# Patient Record
Sex: Female | Born: 1945 | Race: White | Hispanic: No | Marital: Married | Smoking: Former smoker
Health system: Southern US, Community
[De-identification: ages and names within clinical notes are randomized; demographics above are authoritative.]

---

## 2005-01-07 ENCOUNTER — Ambulatory Visit: Payer: Self-pay | Admitting: Unknown Physician Specialty

## 2005-01-28 ENCOUNTER — Ambulatory Visit: Payer: Self-pay | Admitting: Gynecologic Oncology

## 2005-02-03 ENCOUNTER — Ambulatory Visit: Payer: Self-pay | Admitting: Gynecologic Oncology

## 2005-02-26 ENCOUNTER — Ambulatory Visit: Payer: Self-pay | Admitting: Family Medicine

## 2005-03-06 ENCOUNTER — Ambulatory Visit: Payer: Self-pay | Admitting: Gynecologic Oncology

## 2005-08-11 ENCOUNTER — Ambulatory Visit: Payer: Self-pay | Admitting: General Surgery

## 2006-02-05 ENCOUNTER — Ambulatory Visit: Payer: Self-pay | Admitting: Unknown Physician Specialty

## 2006-03-28 ENCOUNTER — Ambulatory Visit: Payer: Self-pay | Admitting: General Surgery

## 2006-05-17 ENCOUNTER — Ambulatory Visit: Payer: Self-pay | Admitting: Psychiatry

## 2006-10-05 ENCOUNTER — Other Ambulatory Visit: Payer: Self-pay

## 2006-10-05 ENCOUNTER — Inpatient Hospital Stay: Payer: Self-pay | Admitting: General Practice

## 2006-10-06 ENCOUNTER — Other Ambulatory Visit: Payer: Self-pay

## 2006-10-31 ENCOUNTER — Ambulatory Visit: Payer: Self-pay | Admitting: General Surgery

## 2007-04-24 ENCOUNTER — Ambulatory Visit: Payer: Self-pay | Admitting: General Surgery

## 2008-11-12 ENCOUNTER — Ambulatory Visit: Payer: Self-pay | Admitting: Unknown Physician Specialty

## 2009-01-21 ENCOUNTER — Ambulatory Visit: Payer: Self-pay | Admitting: Unknown Physician Specialty

## 2009-10-22 ENCOUNTER — Ambulatory Visit: Payer: Self-pay | Admitting: General Surgery

## 2009-12-11 ENCOUNTER — Ambulatory Visit: Payer: Self-pay | Admitting: Unknown Physician Specialty

## 2010-03-17 ENCOUNTER — Ambulatory Visit: Payer: Self-pay | Admitting: Unknown Physician Specialty

## 2010-04-07 ENCOUNTER — Inpatient Hospital Stay (HOSPITAL_COMMUNITY): Admission: RE | Admit: 2010-04-07 | Discharge: 2010-04-08 | Payer: Self-pay | Admitting: Neurosurgery

## 2011-01-07 ENCOUNTER — Ambulatory Visit: Payer: Self-pay | Admitting: Unknown Physician Specialty

## 2011-02-23 LAB — URINE MICROSCOPIC-ADD ON

## 2011-02-23 LAB — CBC
HCT: 38.7 % (ref 36.0–46.0)
Hemoglobin: 13.7 g/dL (ref 12.0–15.0)
MCHC: 35.5 g/dL (ref 30.0–36.0)
MCV: 99.2 fL (ref 78.0–100.0)
Platelets: 224 10*3/uL (ref 150–400)
RBC: 3.9 MIL/uL (ref 3.87–5.11)
RDW: 13.6 % (ref 11.5–15.5)

## 2011-02-23 LAB — DIFFERENTIAL
Basophils Absolute: 0.2 10*3/uL — ABNORMAL HIGH (ref 0.0–0.1)
Lymphocytes Relative: 23 % (ref 12–46)
Lymphs Abs: 2.6 10*3/uL (ref 0.7–4.0)
Monocytes Absolute: 1 10*3/uL (ref 0.1–1.0)
Neutro Abs: 7.2 10*3/uL (ref 1.7–7.7)

## 2011-02-23 LAB — COMPREHENSIVE METABOLIC PANEL
AST: 30 U/L (ref 0–37)
Albumin: 4 g/dL (ref 3.5–5.2)
Alkaline Phosphatase: 73 U/L (ref 39–117)
Chloride: 97 mEq/L (ref 96–112)
GFR calc non Af Amer: >60 mL/min (ref >60)
Potassium: 3.4 mEq/L — ABNORMAL LOW (ref 3.5–5.1)
Total Protein: 6.6 g/dL (ref 6.0–8.3)

## 2011-02-23 LAB — URINALYSIS, ROUTINE W REFLEX MICROSCOPIC
Protein, ur: NEGATIVE mg/dL
Specific Gravity, Urine: 1.017 (ref 1.005–1.030)
Urobilinogen, UA: 1 mg/dL (ref 0.0–1.0)
pH: 6 (ref 5.0–8.0)

## 2011-02-23 LAB — SURGICAL PCR SCREEN: MRSA, PCR: NEGATIVE

## 2011-07-15 ENCOUNTER — Ambulatory Visit: Payer: Self-pay | Admitting: Unknown Physician Specialty

## 2012-02-08 ENCOUNTER — Ambulatory Visit: Payer: Self-pay | Admitting: Internal Medicine

## 2013-02-08 ENCOUNTER — Ambulatory Visit: Payer: Self-pay | Admitting: Physical Medicine and Rehabilitation

## 2013-02-08 ENCOUNTER — Ambulatory Visit: Payer: Self-pay | Admitting: Internal Medicine

## 2014-02-25 ENCOUNTER — Ambulatory Visit: Payer: Self-pay | Admitting: Internal Medicine

## 2014-02-27 ENCOUNTER — Ambulatory Visit: Payer: Self-pay | Admitting: Internal Medicine

## 2014-06-17 DIAGNOSIS — I10 Essential (primary) hypertension: Secondary | ICD-10-CM | POA: Insufficient documentation

## 2014-06-17 DIAGNOSIS — E785 Hyperlipidemia, unspecified: Secondary | ICD-10-CM | POA: Insufficient documentation

## 2014-06-17 DIAGNOSIS — F329 Major depressive disorder, single episode, unspecified: Secondary | ICD-10-CM | POA: Insufficient documentation

## 2014-06-17 DIAGNOSIS — F32A Depression, unspecified: Secondary | ICD-10-CM | POA: Insufficient documentation

## 2014-12-03 IMAGING — US US BREAST*R* LIMITED INC AXILLA
1 series · 7 of 7 positions shown · non-contrast
Comparison: Previous examinations.

CLINICAL DATA: Recall from screening mammogram. History fibrocystic
breast disease.

EXAM:
DIGITAL DIAGNOSTIC  right breast MAMMOGRAM
ULTRASOUND bright BREAST

[Series 1: us breast*right* limited inc axilla · 0.08mm/px · 7 of 7 slices shown]
[im 1/7]
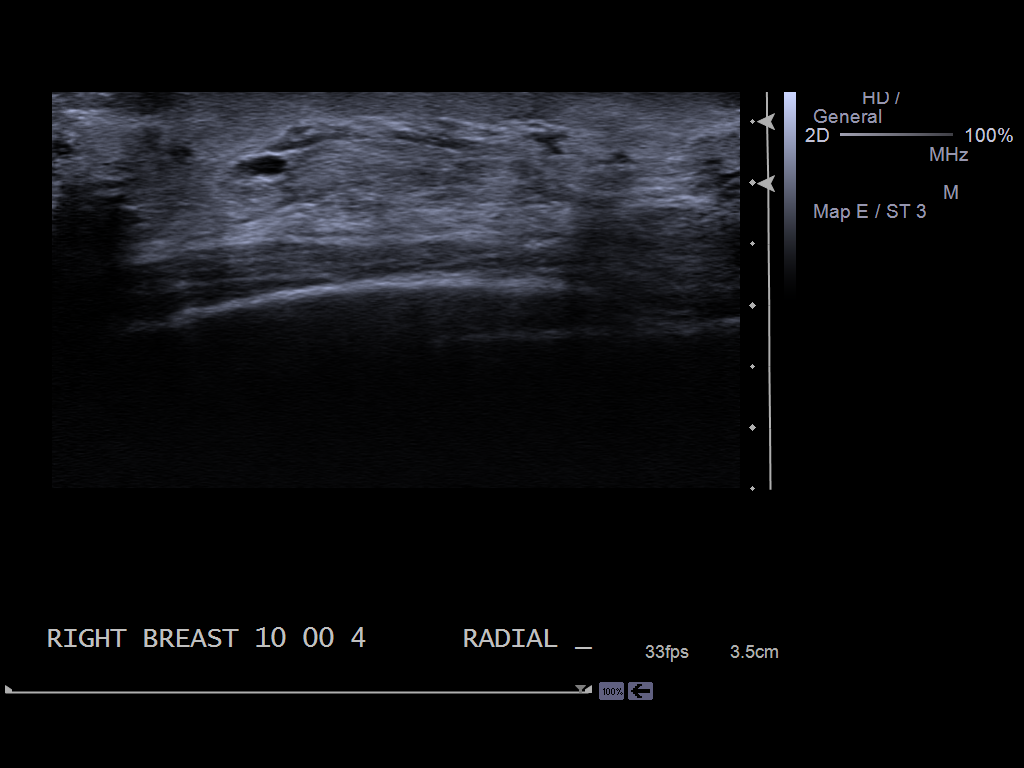
[im 2/7]
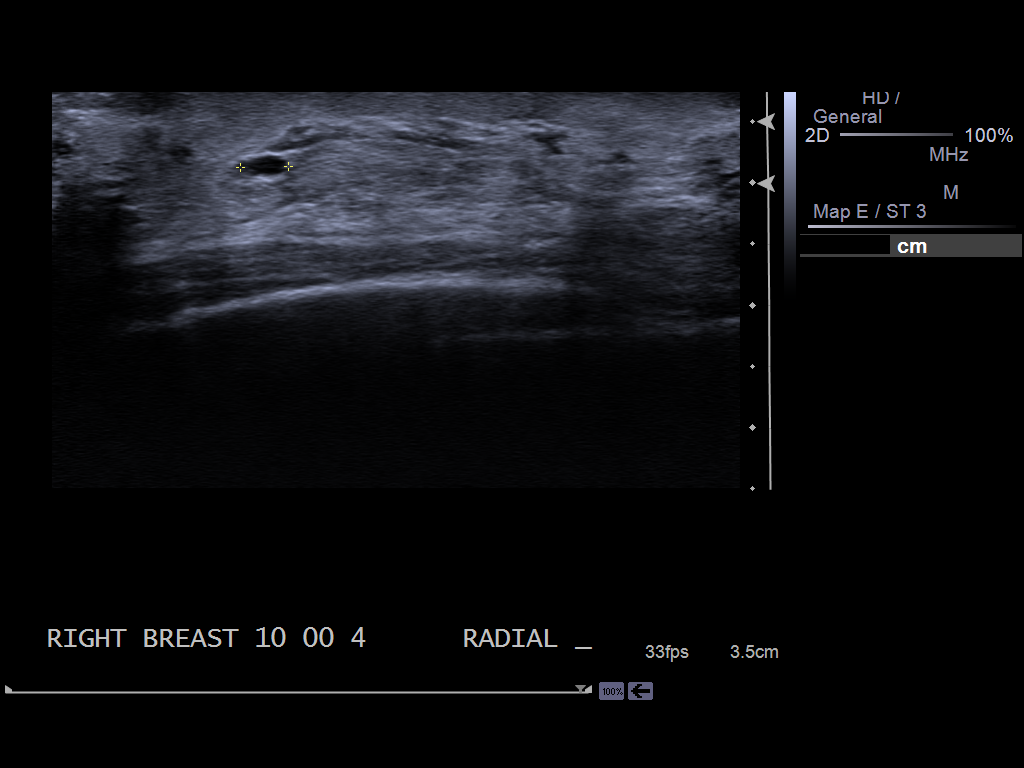
[im 3/7]
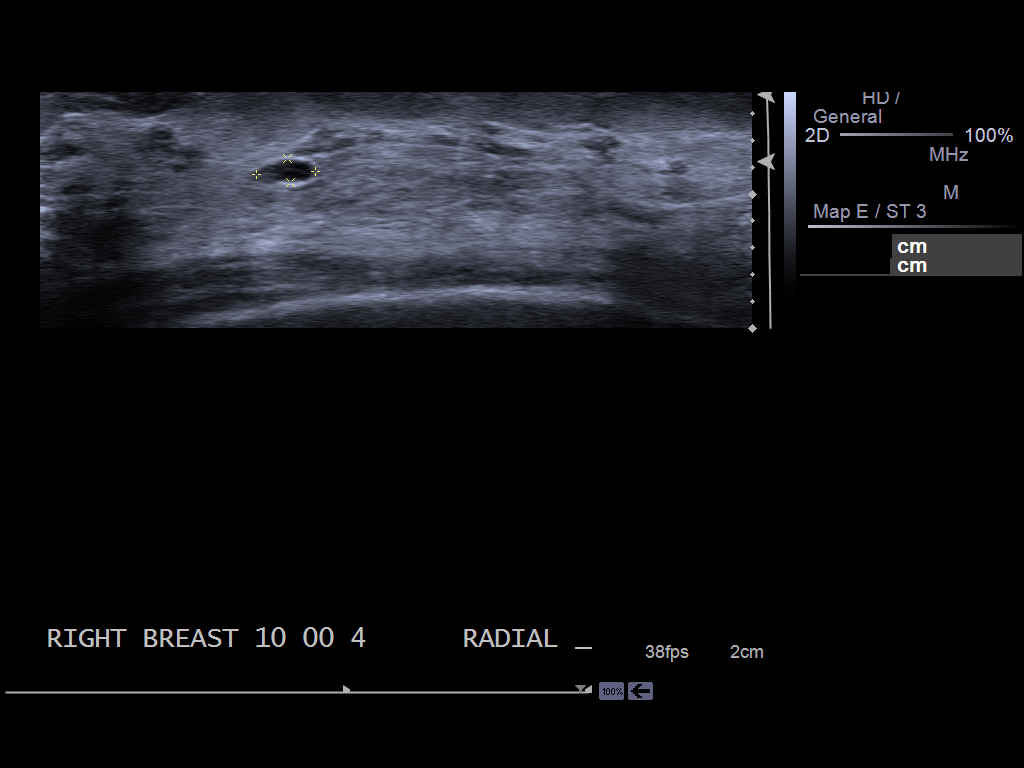
[im 4/7]
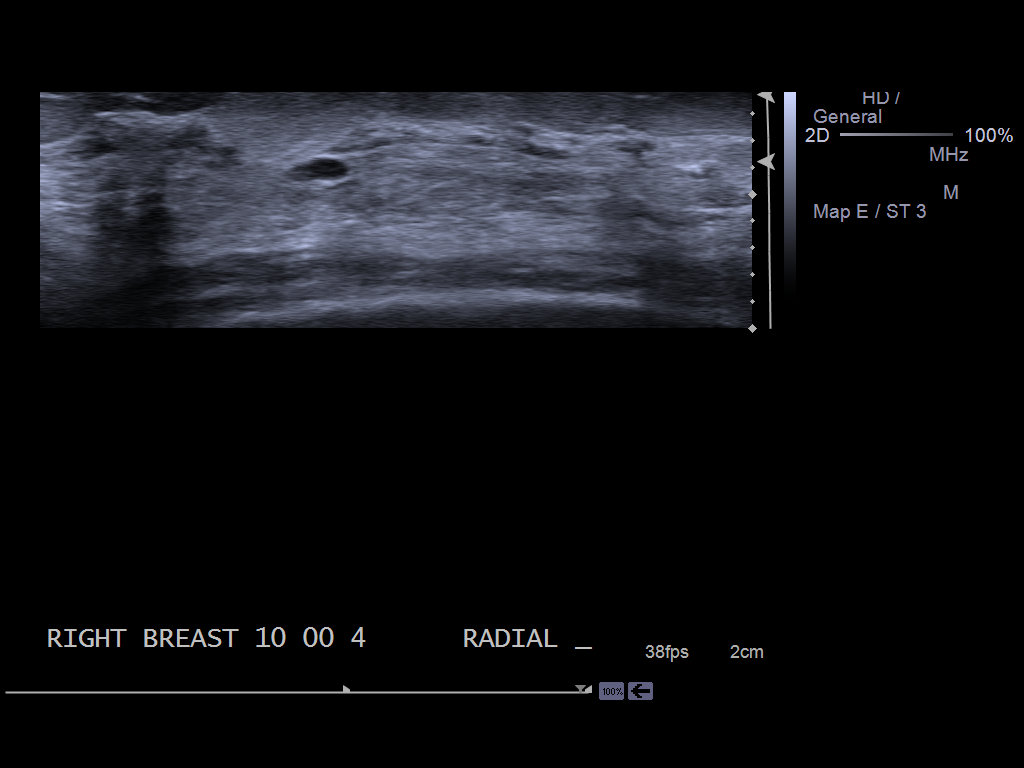
[im 5/7]
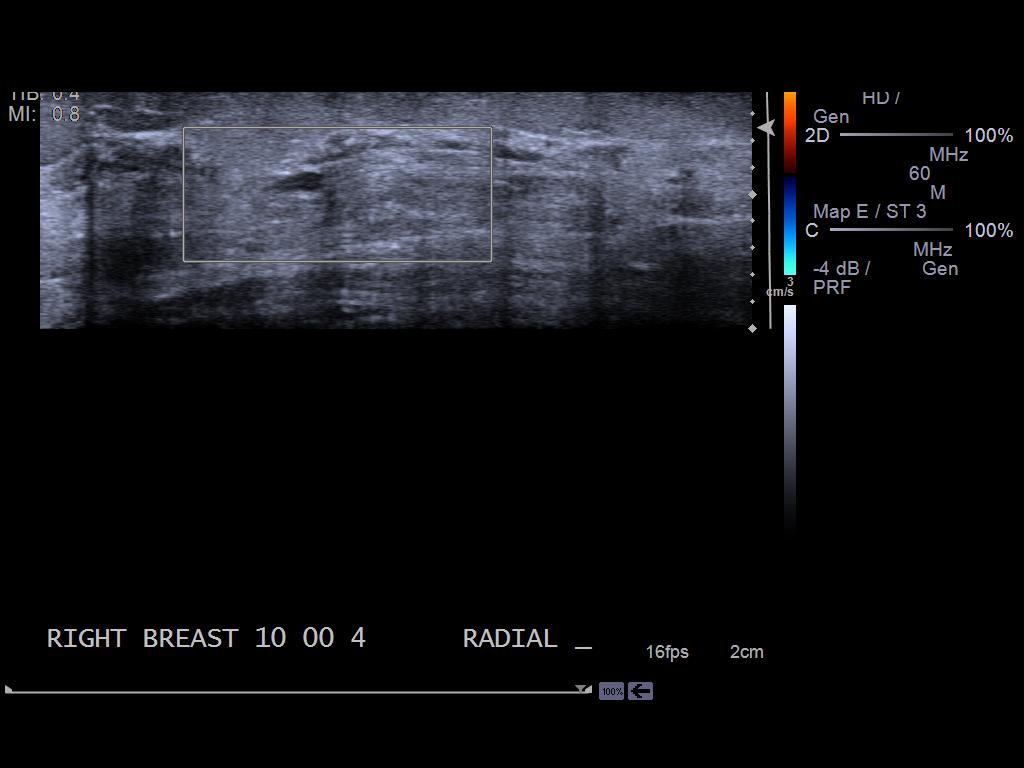
[im 6/7]
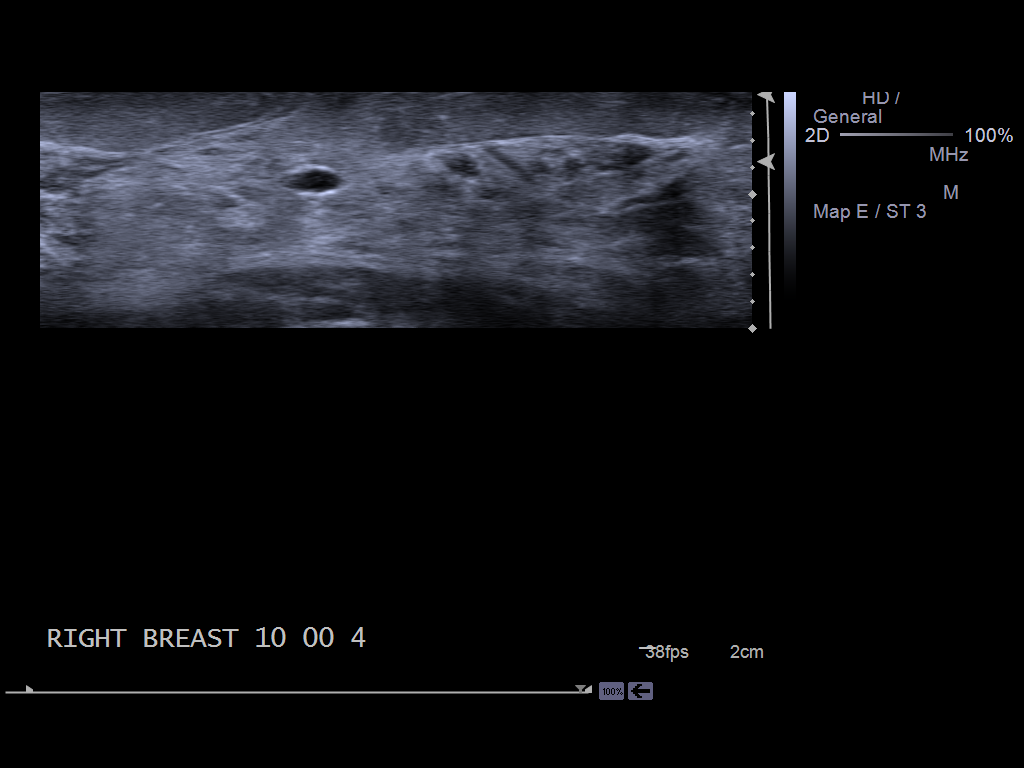
[im 7/7]
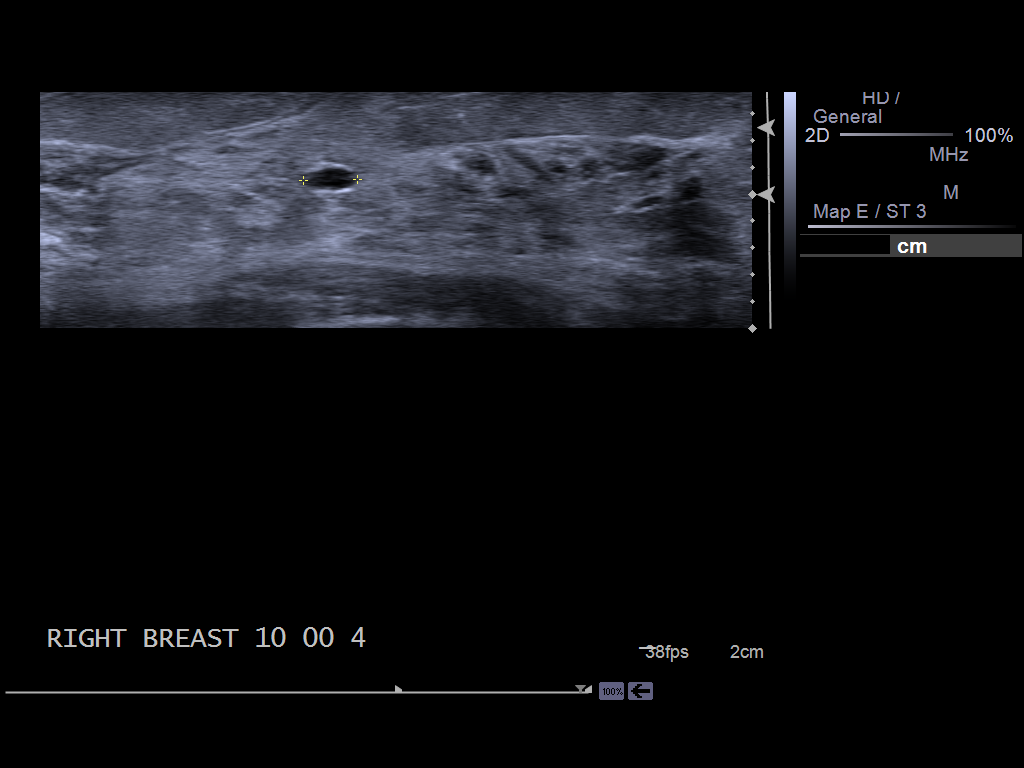

[7 of 7 positions shown; findings below may reference images not displayed]

ACR Breast Density Category c: The breast tissue is heterogeneously
dense, which may obscure small masses.
FINDINGS: Spot compression views of the right breast demonstrate a small,
oval, circumscribed mass be present within the upper outer quadrant
of the right breast located within the anterior [DATE] of the breast.

On physical exam, there is no discrete palpable abnormality within
the upper outer quadrant of the right breast.

Ultrasound is performed, showing an oval simple cyst located within
the right breast at the 10 o'clock position 4 cm from the nipple
corresponding to the mammographic finding. This measures 4 mm in
size.
IMPRESSION: Small (4 mm) simple cyst located within the right breast at the 10
to 11 o'clock position 4 cm from the nipple corresponding to the
mammographic finding. No findings worrisome for malignancy.
Recommend screening mammography in 1 year.

RECOMMENDATION:
Bilateral screening mammography in 1 year.

I have discussed the findings and recommendations with the patient.
Results were also provided in writing at the conclusion of the
visit. If applicable, a reminder letter will be sent to the patient
regarding the next appointment.

BI-RADS CATEGORY  2: Benign Finding(s)

## 2015-03-24 ENCOUNTER — Ambulatory Visit: Admit: 2015-03-24 | Disposition: A | Discharge status: Home / Self Care | Payer: Self-pay

## 2016-06-30 ENCOUNTER — Other Ambulatory Visit: Payer: Self-pay | Admitting: *Deleted

## 2016-06-30 DIAGNOSIS — Z1231 Encounter for screening mammogram for malignant neoplasm of breast: Secondary | ICD-10-CM

## 2016-07-16 ENCOUNTER — Ambulatory Visit: Payer: Self-pay

## 2016-08-06 ENCOUNTER — Ambulatory Visit: Payer: Self-pay

## 2016-08-16 ENCOUNTER — Ambulatory Visit
Admission: RE | Admit: 2016-08-16 | Discharge: 2016-08-16 | Disposition: A | Discharge status: Home / Self Care | Payer: Medicare Other | Source: Ambulatory Visit | Attending: *Deleted | Admitting: *Deleted

## 2016-08-16 ENCOUNTER — Other Ambulatory Visit: Payer: Self-pay | Admitting: *Deleted

## 2016-08-16 DIAGNOSIS — Z1231 Encounter for screening mammogram for malignant neoplasm of breast: Secondary | ICD-10-CM

## 2016-08-28 DIAGNOSIS — C3492 Malignant neoplasm of unspecified part of left bronchus or lung: Secondary | ICD-10-CM | POA: Insufficient documentation

## 2016-09-14 ENCOUNTER — Inpatient Hospital Stay: Payer: Medicare Other | Attending: Internal Medicine | Admitting: Internal Medicine

## 2016-09-14 ENCOUNTER — Encounter: Payer: Self-pay | Admitting: Internal Medicine

## 2016-09-14 ENCOUNTER — Telehealth: Payer: Self-pay | Admitting: Internal Medicine

## 2016-09-14 DIAGNOSIS — R5383 Other fatigue: Secondary | ICD-10-CM | POA: Diagnosis not present

## 2016-09-14 DIAGNOSIS — C3412 Malignant neoplasm of upper lobe, left bronchus or lung: Secondary | ICD-10-CM | POA: Diagnosis present

## 2016-09-14 DIAGNOSIS — F17210 Nicotine dependence, cigarettes, uncomplicated: Secondary | ICD-10-CM | POA: Diagnosis not present

## 2016-09-14 DIAGNOSIS — R05 Cough: Secondary | ICD-10-CM | POA: Insufficient documentation

## 2016-09-14 DIAGNOSIS — Z79899 Other long term (current) drug therapy: Secondary | ICD-10-CM | POA: Insufficient documentation

## 2016-09-14 DIAGNOSIS — R0602 Shortness of breath: Secondary | ICD-10-CM | POA: Diagnosis not present

## 2016-09-14 DIAGNOSIS — G8929 Other chronic pain: Secondary | ICD-10-CM | POA: Diagnosis not present

## 2016-09-14 DIAGNOSIS — R079 Chest pain, unspecified: Secondary | ICD-10-CM | POA: Insufficient documentation

## 2016-09-14 DIAGNOSIS — G500 Trigeminal neuralgia: Secondary | ICD-10-CM | POA: Diagnosis not present

## 2016-09-14 NOTE — Telephone Encounter (Signed)
x

## 2016-09-14 NOTE — Progress Notes (Signed)
Pt recently seen at [facility] and diagnosed with Left lung small cell.

## 2016-09-14 NOTE — Progress Notes (Signed)
Annette Skinner CONSULT NOTE  No care team member to display  CHIEF COMPLAINTS/PURPOSE OF CONSULTATION:  Small cell lung cancer    #  Oncology History   # LIMITED STAGE SMALL CELL LUNG CANCER -LUL mass [~5cm; [name]; [name]; MRI Brain- Negative  # Orofacial dystonia/ trigeminal neuralgia  # smoking     Primary cancer of left upper lobe of lung ([place])   09/14/2016 Initial Diagnosis    Primary cancer of left upper lobe of lung ([facility])       HISTORY OF PRESENTING ILLNESS:  Annette Skinner 70 y.o.  female with recently diagnosed small cell lung cancer diagnosed at [facility]; is here to seek a second opinion regarding her care.  Patient states that she had progressive shortness of breath and cough and chest pain that led to imaging- that showed large 5-6 cm left upper lobe mass with mediastinal adenopathy; and also satellite lesion in the left lung. Patient had a CT-guided biopsy of the left upper lobe- was positive for small cell lung cancer. Patient was currently evaluated by medical oncology/radiation oncology- and appropriately recommended platinum-etoposide chemotherapy with concurrent radiation; she was also offered participation in a clinical trial.   Patient is to complain of significant fatigue; complains of continued shortness of breath with exertion. Intermittent cough. No hemoptysis. Denies any headaches.  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    ROS: A complete 10 point review of system is done which is negative except mentioned above in history of present illness  MEDICAL HISTORY:  History reviewed. No pertinent past medical history.  SURGICAL HISTORY: History reviewed. No pertinent surgical history.  SOCIAL HISTORY: Social History   Social History  . Marital status: Married     Spouse name: N/A  . Number of children: N/A  . Years of education: N/A   Occupational History  . Not on file.   Social History Main Topics  . Smoking status: Former Smoker    Packs/day: 1.00    Years: 40.00    Types: Cigarettes    Quit date: 05/06/2006  . Smokeless tobacco: Never Used  . Alcohol use No  . Drug use: No  . Sexual activity: Not on file   Other Topics Concern  . Not on file   Social History Narrative  . No narrative on file    FAMILY HISTORY: positive for cancer in family.  History reviewed. No pertinent family history.  ALLERGIES:  is allergic to prednisone; sulfa antibiotics; and tape.  MEDICATIONS:  Current Outpatient Prescriptions  Medication Sig Dispense Refill  . Acidophilus Lactobacillus CAPS Take by mouth.    . ALPRAZolam (XANAX) 1 MG tablet     . chlorhexidine (PERIDEX) 0.12 % solution     . Cholecalciferol (VITAMIN D3) 2000 units capsule Take by mouth.    . clonazePAM (KLONOPIN) 2 MG tablet     . desoximetasone (TOPICORT) 0.25 % cream     . estradiol (ESTRACE) 0.5 MG tablet     . fluconazole (DIFLUCAN) 150 MG tablet     . hydrochlorothiazide (MICROZIDE) 12.5 MG capsule     . KLOR-CON SPRINKLE 10 MEQ CR capsule     . metoprolol succinate (TOPROL-XL) 25 MG 24 hr tablet     . mirtazapine (REMERON) 15 MG tablet     . ondansetron (ZOFRAN-ODT) 8 MG disintegrating tablet  TAKE 1 TABLET (8 MG TOTAL) BY MOUTH EVERY 8 (EIGHT) HOURS AS NEEDED FOR NAUSEA FOR UP TO 7 DAYS.    Marland Kitchen oxyCODONE-acetaminophen (PERCOCET) 10-325 MG tablet     . pantoprazole (PROTONIX) 40 MG tablet     . prochlorperazine (COMPAZINE) 10 MG tablet Take by mouth.    . silver sulfADIAZINE (SILVADENE) 1 % cream     . valACYclovir (VALTREX) 500 MG tablet     . valsartan (DIOVAN) 160 MG tablet     . venlafaxine XR (EFFEXOR-XR) 37.5 MG 24 hr capsule      No current facility-administered medications for this visit.       Marland Kitchen  PHYSICAL EXAMINATION: ECOG PERFORMANCE STATUS: 1 -  Symptomatic but completely ambulatory  Vitals:   09/14/16 1519  BP: 133/79  Pulse: 68  Resp: 17  Temp: 97.5 F (36.4 C)   Filed Weights   09/14/16 1519  Weight: 103 lb 6.3 oz (46.9 kg)    GENERAL: Well-nourished well-developed; Alert, no distress and comfortable.   With her husband  EYES: no pallor or icterus OROPHARYNX: no thrush or ulceration; good dentition  NECK: supple, no masses felt LYMPH:  no palpable lymphadenopathy in the cervical, axillary or inguinal regions LUNGS: Decreased breath sounds to auscultation bilaterally and  No wheeze or crackles HEART/CVS: regular rate & rhythm and no murmurs; No lower extremity edema ABDOMEN: abdomen soft, non-tender and normal bowel sounds Musculoskeletal:no cyanosis of digits and no clubbing  PSYCH: alert & oriented x 3 with fluent speech NEURO: no focal motor/sensory deficits SKIN:  no rashes or significant lesions; venous engorgement noted in the left chest wall  LABORATORY DATA:  I have reviewed the data as listed Lab Results  Component Value Date   WBC 11.3 (H) 04/07/2010   HGB 13.7 04/07/2010   HCT 38.7 04/07/2010   MCV 99.2 04/07/2010   PLT 224 04/07/2010   No results for input(s): NA, K, CL, CO2, GLUCOSE, BUN, CREATININE, CALCIUM, GFRNONAA, GFRAA, PROT, ALBUMIN, AST, ALT, ALKPHOS, BILITOT, BILIDIR, IBILI in the last 8760 hours.  RADIOGRAPHIC STUDIES: I have personally reviewed the radiological images as listed and agreed with the findings in the report. Mm Screening Breast Tomo Bilateral  Result Date: 08/16/2016 CLINICAL DATA:  Screening. EXAM: 2D DIGITAL SCREENING BILATERAL MAMMOGRAM WITH CAD AND ADJUNCT TOMO COMPARISON:  Previous exam(s). ACR Breast Density Category c: The breast tissue is heterogeneously dense, which may obscure small masses. FINDINGS: There are no findings suspicious for malignancy. Images were processed with CAD. IMPRESSION: No mammographic evidence of malignancy. A result letter of this screening  mammogram will be mailed directly to the patient. RECOMMENDATION: Screening mammogram in one year. (Code:SM-B-01Y) BI-RADS CATEGORY  1: Negative. Electronically Signed   By: Franki Cabot M.D.   On: 08/16/2016 13:18    ASSESSMENT & PLAN:   Primary cancer of left upper lobe of lung ([facility]CC) Limited stage small cell lung cancer. I reviewed the pathology and staging with the patient and husband in detail.  # I agree with recommendations from [place]- to offer standard platinum- etoposide chemotherapy therapy along with concurrent radiation. Patient family do understand- that even with aggressive therapy with chemoradiation- cure is in the order of 5-10%; median survival is about 18 months. There is definitely high risk of side effects with combination therapy with- esophagitis/ increasing fatigue etc.   # Given the patient's significant concern for avoiding the side effects especially from radiation/and her limited quality of her life from her trigeminal neuralgia- I  think is reasonable to offer systemic therapy with carboplatin- etoposide. Patient does understand this is palliative/not curative. Median survival is 8-12 months.   # After lengthy discussion/ patient and husband- state that they might be interested in systemic therapy alone; however have not made any decisions yet. They will let us know in the next few days.  Also discussed re: port.   # also reviewed that without treatment-  Life expectancy is in the order of few months. This option was also given to the patient/family at [facility].  # 60 minutes face-to-face with the patient discussing the above plan of care; more than 50% of time spent on prognosis/ natural history; counseling and coordination.       All questions were answered. The patient knows to call the clinic with any problems, questions or concerns.       Cammie Sickle, MD 09/15/2016 8:31 AM

## 2016-09-15 NOTE — Assessment & Plan Note (Signed)
Limited stage small cell lung cancer. I reviewed the pathology and staging with the patient and husband in detail.  # I agree with recommendations from [place]- to offer standard platinum- etoposide chemotherapy therapy along with concurrent radiation. Patient family do understand- that even with aggressive therapy with chemoradiation- cure is in the order of 5-10%; median survival is about 18 months. There is definitely high risk of side effects with combination therapy with- esophagitis/ increasing fatigue etc.   # Given the patient's significant concern for avoiding the side effects especially from radiation/and her limited quality of her life from her trigeminal neuralgia- I think is reasonable to offer systemic therapy with carboplatin- etoposide. Patient does understand this is palliative/not curative. Median survival is 8-12 months.   # After lengthy discussion/ patient and husband- state that they might be interested in systemic therapy alone; however have not made any decisions yet. They will let us know in the next few days.  Also discussed re: port.   # also reviewed that without treatment-  Life expectancy is in the order of few months. This option was also given to the patient/family at [facility].  # 60 minutes face-to-face with the patient discussing the above plan of care; more than 50% of time spent on prognosis/ natural history; counseling and coordination.

## 2017-05-06 DEATH — deceased
